# Patient Record
Sex: Male | Born: 1990 | Race: Black or African American | Hispanic: No | Marital: Single | Smoking: Never smoker
Health system: Southern US, Community
[De-identification: ages and names within clinical notes are randomized; demographics above are authoritative.]

## PROBLEM LIST (undated history)

## (undated) DIAGNOSIS — J45909 Unspecified asthma, uncomplicated: Secondary | ICD-10-CM

---
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
## 2015-11-17 ENCOUNTER — Emergency Department (HOSPITAL_COMMUNITY): Admit: 2015-11-17 | Discharge: 2015-11-17 | Discharge status: Absent without leave | Payer: Self-pay

## 2020-12-26 ENCOUNTER — Emergency Department (HOSPITAL_BASED_OUTPATIENT_CLINIC_OR_DEPARTMENT_OTHER)
Admission: EM | Admit: 2020-12-26 | Discharge: 2020-12-27 | Disposition: A | Discharge status: Home / Self Care | Payer: No Typology Code available for payment source | Source: Home / Self Care | Attending: Emergency Medicine | Admitting: Emergency Medicine

## 2020-12-26 ENCOUNTER — Encounter (HOSPITAL_BASED_OUTPATIENT_CLINIC_OR_DEPARTMENT_OTHER): Payer: Self-pay

## 2020-12-26 ENCOUNTER — Encounter (HOSPITAL_BASED_OUTPATIENT_CLINIC_OR_DEPARTMENT_OTHER): Payer: Self-pay | Admitting: *Deleted

## 2020-12-26 ENCOUNTER — Emergency Department (HOSPITAL_BASED_OUTPATIENT_CLINIC_OR_DEPARTMENT_OTHER)
Admission: EM | Admit: 2020-12-26 | Discharge: 2020-12-26 | Disposition: A | Discharge status: Home / Self Care | Payer: No Typology Code available for payment source | Attending: Emergency Medicine | Admitting: Emergency Medicine

## 2020-12-26 ENCOUNTER — Emergency Department (HOSPITAL_BASED_OUTPATIENT_CLINIC_OR_DEPARTMENT_OTHER): Payer: No Typology Code available for payment source

## 2020-12-26 ENCOUNTER — Other Ambulatory Visit: Payer: Self-pay

## 2020-12-26 DIAGNOSIS — M79652 Pain in left thigh: Secondary | ICD-10-CM | POA: Insufficient documentation

## 2020-12-26 DIAGNOSIS — M25522 Pain in left elbow: Secondary | ICD-10-CM | POA: Diagnosis not present

## 2020-12-26 DIAGNOSIS — R112 Nausea with vomiting, unspecified: Secondary | ICD-10-CM | POA: Insufficient documentation

## 2020-12-26 DIAGNOSIS — R911 Solitary pulmonary nodule: Secondary | ICD-10-CM | POA: Insufficient documentation

## 2020-12-26 DIAGNOSIS — Y92411 Interstate highway as the place of occurrence of the external cause: Secondary | ICD-10-CM | POA: Insufficient documentation

## 2020-12-26 DIAGNOSIS — M25562 Pain in left knee: Secondary | ICD-10-CM | POA: Insufficient documentation

## 2020-12-26 DIAGNOSIS — R519 Headache, unspecified: Secondary | ICD-10-CM | POA: Diagnosis not present

## 2020-12-26 DIAGNOSIS — J45909 Unspecified asthma, uncomplicated: Secondary | ICD-10-CM | POA: Insufficient documentation

## 2020-12-26 DIAGNOSIS — F1210 Cannabis abuse, uncomplicated: Secondary | ICD-10-CM

## 2020-12-26 DIAGNOSIS — M25561 Pain in right knee: Secondary | ICD-10-CM | POA: Diagnosis not present

## 2020-12-26 DIAGNOSIS — M542 Cervicalgia: Secondary | ICD-10-CM | POA: Insufficient documentation

## 2020-12-26 HISTORY — DX: Unspecified asthma, uncomplicated: J45.909

## 2020-12-26 LAB — BASIC METABOLIC PANEL
Anion gap: 9 (ref 5–15)
BUN: 10 mg/dL (ref 6–20)
CO2: 26 mmol/L (ref 22–32)
Calcium: 9.3 mg/dL (ref 8.9–10.3)
Chloride: 102 mmol/L (ref 98–111)
Creatinine, Ser: 1.11 mg/dL (ref 0.61–1.24)
GFR, Estimated: >60 mL/min (ref >60)
Glucose, Bld: 115 mg/dL — ABNORMAL HIGH (ref 70–99)
Potassium: 4.1 mmol/L (ref 3.5–5.1)
Sodium: 137 mmol/L (ref 135–145)

## 2020-12-26 LAB — CBC WITH DIFFERENTIAL/PLATELET
Abs Immature Granulocytes: 0.01 10*3/uL (ref 0.00–0.07)
Basophils Absolute: 0 10*3/uL (ref 0.0–0.1)
Basophils Relative: 1 %
Eosinophils Absolute: 0 10*3/uL (ref 0.0–0.5)
Eosinophils Relative: 0 %
HCT: 45 % (ref 39.0–52.0)
Hemoglobin: 15.6 g/dL (ref 13.0–17.0)
Immature Granulocytes: 0 %
Lymphocytes Relative: 15 %
Lymphs Abs: 0.8 10*3/uL (ref 0.7–4.0)
MCH: 29.4 pg (ref 26.0–34.0)
MCHC: 34.7 g/dL (ref 30.0–36.0)
MCV: 84.7 fL (ref 80.0–100.0)
Monocytes Absolute: 0.6 10*3/uL (ref 0.1–1.0)
Monocytes Relative: 11 %
Neutro Abs: 3.8 10*3/uL (ref 1.7–7.7)
Neutrophils Relative %: 73 %
Platelets: 195 10*3/uL (ref 150–400)
RBC: 5.31 MIL/uL (ref 4.22–5.81)
RDW: 13.2 % (ref 11.5–15.5)
WBC: 5.2 10*3/uL (ref 4.0–10.5)
nRBC: 0 % (ref 0.0–0.2)

## 2020-12-26 LAB — CBG MONITORING, ED: Glucose-Capillary: 149 mg/dL — ABNORMAL HIGH (ref 70–99)

## 2020-12-26 MED ORDER — ONDANSETRON 4 MG PO TBDP: 4.0000 mg | ORAL_TABLET | Freq: Once | ORAL | Status: DC

## 2020-12-26 MED ORDER — ONDANSETRON HCL 4 MG/2ML IJ SOLN
4.0000 mg | Freq: Once | INTRAMUSCULAR | Status: AC
Start: 2020-12-26 — End: 2020-12-26
  Administered 2020-12-26: 4 mg via INTRAVENOUS
  Filled 2020-12-26: qty 2

## 2020-12-26 MED ORDER — IOHEXOL 300 MG/ML  SOLN
100.0000 mL | Freq: Once | INTRAMUSCULAR | Status: AC | PRN
  Administered 2020-12-26: 100 mL via INTRAVENOUS

## 2020-12-26 MED ORDER — OXYCODONE-ACETAMINOPHEN 5-325 MG PO TABS: 1.0000 | ORAL_TABLET | Freq: Once | ORAL | Status: DC

## 2020-12-26 MED ORDER — NALOXONE HCL 4 MG/0.1ML NA LIQD
1.0000 | Freq: Once | NASAL | Status: DC
Start: 2020-12-26 — End: 2020-12-27

## 2020-12-26 MED ORDER — FENTANYL CITRATE (PF) 100 MCG/2ML IJ SOLN
50.0000 ug | Freq: Once | INTRAMUSCULAR | Status: AC
  Administered 2020-12-26: 50 ug via INTRAVENOUS
  Filled 2020-12-26: qty 2

## 2020-12-26 NOTE — Discharge Instructions (Addendum)
Your CT of your chest showed evidence of 2 pulmonary nodules in your right lung.  This is an incidental finding but should be something that is monitored by your primary care doctor.
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
## 2020-12-26 NOTE — ED Notes (Signed)
Patient transported to X-ray 

## 2020-12-26 NOTE — ED Notes (Signed)
Bedside handoff report given to [facility]
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
## 2020-12-26 NOTE — ED Notes (Signed)
Patient transported to CT via stretcher, sr x 2 up, Handcuffs removed by [employer] staff, [employer] at bedside
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
## 2020-12-26 NOTE — ED Triage Notes (Signed)
Pt had gone AMA, pt returns with [employer] pt c/o HA, dizziness, vomited x 3, large amt of emesis. Immediately
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
## 2020-12-27 ENCOUNTER — Other Ambulatory Visit: Payer: Self-pay

## 2020-12-27 LAB — RAPID URINE DRUG SCREEN, HOSP PERFORMED
Amphetamines: NOT DETECTED
Barbiturates: NOT DETECTED
Benzodiazepines: NOT DETECTED
Cocaine: NOT DETECTED
Opiates: NOT DETECTED
Tetrahydrocannabinol: POSITIVE — AB

## 2020-12-27 NOTE — ED Notes (Signed)
Patient ambulated without difficulty. Patient PO challenged successfully. Patient informed of need for future CT scan in 3 months per discharge paperwork.

## 2021-01-13 ENCOUNTER — Emergency Department (HOSPITAL_BASED_OUTPATIENT_CLINIC_OR_DEPARTMENT_OTHER)
Admission: EM | Admit: 2021-01-13 | Discharge: 2021-01-14 | Disposition: A | Discharge status: Home / Self Care | Payer: Self-pay | Attending: Emergency Medicine | Admitting: Emergency Medicine

## 2021-01-13 ENCOUNTER — Other Ambulatory Visit: Payer: Self-pay

## 2021-01-13 ENCOUNTER — Encounter (HOSPITAL_BASED_OUTPATIENT_CLINIC_OR_DEPARTMENT_OTHER): Payer: Self-pay | Admitting: *Deleted

## 2021-01-13 ENCOUNTER — Emergency Department (HOSPITAL_BASED_OUTPATIENT_CLINIC_OR_DEPARTMENT_OTHER): Payer: Self-pay

## 2021-01-13 DIAGNOSIS — T148XXA Other injury of unspecified body region, initial encounter: Secondary | ICD-10-CM

## 2021-01-13 DIAGNOSIS — M25562 Pain in left knee: Secondary | ICD-10-CM

## 2021-01-13 DIAGNOSIS — J45909 Unspecified asthma, uncomplicated: Secondary | ICD-10-CM | POA: Insufficient documentation

## 2021-01-13 DIAGNOSIS — S80212A Abrasion, left knee, initial encounter: Secondary | ICD-10-CM | POA: Insufficient documentation

## 2021-01-13 DIAGNOSIS — Y92410 Unspecified street and highway as the place of occurrence of the external cause: Secondary | ICD-10-CM | POA: Insufficient documentation

## 2021-01-13 DIAGNOSIS — Z23 Encounter for immunization: Secondary | ICD-10-CM | POA: Insufficient documentation

## 2021-01-13 MED ORDER — HYDROCODONE-ACETAMINOPHEN 7.5-325 MG PO TABS: 1.0000 | ORAL_TABLET | Freq: Four times a day (QID) | ORAL | 0 refills | Status: DC | PRN

## 2021-01-13 MED ORDER — CEPHALEXIN 500 MG PO CAPS: 500.0000 mg | ORAL_CAPSULE | Freq: Four times a day (QID) | ORAL | 0 refills | Status: DC

## 2021-01-13 MED ORDER — CEPHALEXIN 250 MG PO CAPS
500.0000 mg | ORAL_CAPSULE | Freq: Once | ORAL | Status: AC
  Administered 2021-01-13: 500 mg via ORAL
  Filled 2021-01-13: qty 2

## 2021-01-13 MED ORDER — TETANUS-DIPHTH-ACELL PERTUSSIS 5-2.5-18.5 LF-MCG/0.5 IM SUSY
0.5000 mL | PREFILLED_SYRINGE | Freq: Once | INTRAMUSCULAR | Status: AC
  Administered 2021-01-13: 0.5 mL via INTRAMUSCULAR
  Filled 2021-01-13: qty 0.5

## 2021-01-13 MED ORDER — HYDROCODONE-ACETAMINOPHEN 5-325 MG PO TABS
2.0000 | ORAL_TABLET | Freq: Once | ORAL | Status: AC
  Administered 2021-01-13: 2 via ORAL
  Filled 2021-01-13: qty 2

## 2021-01-13 MED ORDER — BACITRACIN ZINC 500 UNIT/GM EX OINT: 1.0000 "application " | TOPICAL_OINTMENT | Freq: Two times a day (BID) | CUTANEOUS | 0 refills | Status: DC

## 2021-01-13 MED ORDER — HYDROCODONE-ACETAMINOPHEN 7.5-325 MG PO TABS
1.0000 | ORAL_TABLET | Freq: Once | ORAL | Status: DC
Start: 2021-01-13 — End: 2021-01-13

## 2021-01-13 MED ORDER — HYDROCODONE-ACETAMINOPHEN 7.5-325 MG PO TABS: 1.0000 | ORAL_TABLET | Freq: Four times a day (QID) | ORAL | 0 refills | Status: AC | PRN
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
## 2021-01-13 NOTE — ED Notes (Signed)
INSTRUCTED PT TO REMAIN NPO UNTIL FURTHER ORDERS

## 2021-01-13 NOTE — ED Notes (Signed)
Pt states he took 2 500mg  Tylenol PO approx prior to ED arrival

## 2021-01-13 NOTE — ED Notes (Signed)
Patient transported to X-ray 

## 2021-01-13 NOTE — Discharge Instructions (Addendum)
You have been seen and discharged from the emergency department.  Your x-rays were negative for fracture.  The knee wound was cleaned, there was possible retained foreign body.  You need to follow-up with orthopedics for evaluation of possible foreign body as well as possible ligamental injury to the knee.  Use crutches and Ace wrap to be nonweightbearing on the left knee.  Take antibiotic as directed.  Use antibiotic ointment as directed.  Keep the wound dressed for the next 48 hours. You have been prescribed pain medicine, take as directed.  Do not mix this medication with alcohol or other sedating medications. Do not drive or do heavy physical activity and to know how this medication affects you.  It may cause drowsiness. Take home medications as prescribed. If you have any worsening symptoms, severe pain, swelling of the lower extremity or further concerns for your health please return to an emergency department for further evaluation.
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
## 2021-02-01 ENCOUNTER — Emergency Department (HOSPITAL_COMMUNITY): Payer: Self-pay

## 2021-02-01 ENCOUNTER — Encounter (HOSPITAL_COMMUNITY): Payer: Self-pay | Admitting: Emergency Medicine

## 2021-02-01 ENCOUNTER — Encounter (HOSPITAL_COMMUNITY): Admission: EM | Disposition: A | Discharge status: Home / Self Care | Payer: Self-pay | Source: Home / Self Care

## 2021-02-01 ENCOUNTER — Inpatient Hospital Stay (HOSPITAL_COMMUNITY)
Admission: EM | Admit: 2021-02-01 | Discharge: 2021-02-05 | DRG: 330 | Disposition: A | Discharge status: Home / Self Care | Payer: Self-pay | Attending: General Surgery | Admitting: General Surgery

## 2021-02-01 ENCOUNTER — Emergency Department (HOSPITAL_COMMUNITY): Payer: Self-pay | Admitting: Anesthesiology

## 2021-02-01 ENCOUNTER — Other Ambulatory Visit: Payer: Self-pay

## 2021-02-01 DIAGNOSIS — Z23 Encounter for immunization: Secondary | ICD-10-CM

## 2021-02-01 DIAGNOSIS — Z91013 Allergy to seafood: Secondary | ICD-10-CM

## 2021-02-01 DIAGNOSIS — Z20822 Contact with and (suspected) exposure to covid-19: Secondary | ICD-10-CM | POA: Diagnosis present

## 2021-02-01 DIAGNOSIS — Y9289 Other specified places as the place of occurrence of the external cause: Secondary | ICD-10-CM

## 2021-02-01 DIAGNOSIS — J45909 Unspecified asthma, uncomplicated: Secondary | ICD-10-CM | POA: Diagnosis present

## 2021-02-01 DIAGNOSIS — S36498A Other injury of other part of small intestine, initial encounter: Principal | ICD-10-CM | POA: Diagnosis present

## 2021-02-01 DIAGNOSIS — S36593A Other injury of sigmoid colon, initial encounter: Secondary | ICD-10-CM | POA: Diagnosis present

## 2021-02-01 DIAGNOSIS — R112 Nausea with vomiting, unspecified: Secondary | ICD-10-CM | POA: Diagnosis not present

## 2021-02-01 DIAGNOSIS — W3400XA Accidental discharge from unspecified firearms or gun, initial encounter: Secondary | ICD-10-CM

## 2021-02-01 DIAGNOSIS — I10 Essential (primary) hypertension: Secondary | ICD-10-CM | POA: Diagnosis not present

## 2021-02-01 HISTORY — PX: LAPAROTOMY: SHX154

## 2021-02-01 HISTORY — PX: BOWEL RESECTION: SHX1257

## 2021-02-01 HISTORY — PX: COLON RESECTION SIGMOID: SHX6737

## 2021-02-01 LAB — BPAM RBC
Blood Product Expiration Date: 202206112359
Blood Product Expiration Date: 202206112359
ISSUE DATE / TIME: 202205140403
ISSUE DATE / TIME: 202205140403
Unit Type and Rh: 5100
Unit Type and Rh: 5100

## 2021-02-01 LAB — COMPREHENSIVE METABOLIC PANEL
ALT: 12 U/L (ref 0–44)
AST: 25 U/L (ref 15–41)
Albumin: 4.8 g/dL (ref 3.5–5.0)
Alkaline Phosphatase: 46 U/L (ref 38–126)
Anion gap: 19 — ABNORMAL HIGH (ref 5–15)
BUN: 14 mg/dL (ref 6–20)
CO2: 15 mmol/L — ABNORMAL LOW (ref 22–32)
Calcium: 9.5 mg/dL (ref 8.9–10.3)
Chloride: 105 mmol/L (ref 98–111)
Creatinine, Ser: 1.43 mg/dL — ABNORMAL HIGH (ref 0.61–1.24)
GFR, Estimated: >60 mL/min (ref >60)
Glucose, Bld: 165 mg/dL — ABNORMAL HIGH (ref 70–99)
Potassium: 3 mmol/L — ABNORMAL LOW (ref 3.5–5.1)
Sodium: 139 mmol/L (ref 135–145)
Total Bilirubin: 0.5 mg/dL (ref 0.3–1.2)
Total Protein: 8.1 g/dL (ref 6.5–8.1)

## 2021-02-01 LAB — CBC
HCT: 44 % (ref 39.0–52.0)
HCT: 39.4 % (ref 39.0–52.0)
Hemoglobin: 14.6 g/dL (ref 13.0–17.0)
Hemoglobin: 13.4 g/dL (ref 13.0–17.0)
MCH: 29.3 pg (ref 26.0–34.0)
MCH: 29.6 pg (ref 26.0–34.0)
MCHC: 33.2 g/dL (ref 30.0–36.0)
MCHC: 34 g/dL (ref 30.0–36.0)
MCV: 88.2 fL (ref 80.0–100.0)
MCV: 87 fL (ref 80.0–100.0)
Platelets: 304 10*3/uL (ref 150–400)
Platelets: 211 10*3/uL (ref 150–400)
RBC: 4.99 MIL/uL (ref 4.22–5.81)
RBC: 4.53 MIL/uL (ref 4.22–5.81)
RDW: 13.6 % (ref 11.5–15.5)
RDW: 13.5 % (ref 11.5–15.5)
WBC: 7.8 10*3/uL (ref 4.0–10.5)
WBC: 9.6 10*3/uL (ref 4.0–10.5)
nRBC: 0 % (ref 0.0–0.2)
nRBC: 0 % (ref 0.0–0.2)

## 2021-02-01 LAB — CREATININE, SERUM
Creatinine, Ser: 0.96 mg/dL (ref 0.61–1.24)
GFR, Estimated: >60 mL/min (ref >60)

## 2021-02-01 LAB — TYPE AND SCREEN
ABO/RH(D): A POS
Antibody Screen: NEGATIVE
Unit division: 0
Unit division: 0

## 2021-02-01 LAB — BLOOD PRODUCT ORDER (VERBAL) VERIFICATION

## 2021-02-01 LAB — PROTIME-INR
INR: 1.1 (ref 0.8–1.2)
Prothrombin Time: 13.7 seconds (ref 11.4–15.2)

## 2021-02-01 LAB — POCT I-STAT 7, (LYTES, BLD GAS, ICA,H+H)
Acid-base deficit: 2 mmol/L (ref 0.0–2.0)
Bicarbonate: 24.1 mmol/L (ref 20.0–28.0)
Calcium, Ion: 1.17 mmol/L (ref 1.15–1.40)
HCT: 36 % — ABNORMAL LOW (ref 39.0–52.0)
Hemoglobin: 12.2 g/dL — ABNORMAL LOW (ref 13.0–17.0)
O2 Saturation: 100 %
Patient temperature: 35
Potassium: 3.2 mmol/L — ABNORMAL LOW (ref 3.5–5.1)
Sodium: 140 mmol/L (ref 135–145)
TCO2: 26 mmol/L (ref 22–32)
pCO2 arterial: 42.3 mmHg (ref 32.0–48.0)
pH, Arterial: 7.355 (ref 7.350–7.450)
pO2, Arterial: 609 mmHg — ABNORMAL HIGH (ref 83.0–108.0)

## 2021-02-01 LAB — PREPARE FRESH FROZEN PLASMA: Unit division: 0

## 2021-02-01 LAB — SAMPLE TO BLOOD BANK

## 2021-02-01 LAB — HIV ANTIBODY (ROUTINE TESTING W REFLEX): HIV Screen 4th Generation wRfx: NONREACTIVE

## 2021-02-01 LAB — BPAM FFP
Blood Product Expiration Date: 202205172359
Blood Product Expiration Date: 202205172359
ISSUE DATE / TIME: 202205140404
ISSUE DATE / TIME: 202205140404
Unit Type and Rh: 6200
Unit Type and Rh: 6200

## 2021-02-01 LAB — ETHANOL: Alcohol, Ethyl (B): <10 mg/dL (ref <10)

## 2021-02-01 LAB — RESP PANEL BY RT-PCR (FLU A&B, COVID) ARPGX2
Influenza A by PCR: NEGATIVE
Influenza B by PCR: NEGATIVE
SARS Coronavirus 2 by RT PCR: NEGATIVE

## 2021-02-01 LAB — LACTIC ACID, PLASMA: Lactic Acid, Venous: 0.7 mmol/L (ref 0.5–1.9)

## 2021-02-01 SURGERY — LAPAROTOMY, EXPLORATORY
Anesthesia: General | Site: Abdomen

## 2021-02-01 MED ORDER — ARTIFICIAL TEARS OPHTHALMIC OINT
TOPICAL_OINTMENT | OPHTHALMIC | Status: AC
  Filled 2021-02-01: qty 3.5

## 2021-02-01 MED ORDER — MORPHINE SULFATE (PF) 2 MG/ML IV SOLN
2.0000 mg | INTRAVENOUS | Status: DC | PRN
  Administered 2021-02-01 – 2021-02-02 (×6): 4 mg via INTRAVENOUS
  Filled 2021-02-01 (×6): qty 2

## 2021-02-01 MED ORDER — ACETAMINOPHEN 10 MG/ML IV SOLN
1000.0000 mg | Freq: Once | INTRAVENOUS | Status: DC | PRN
  Administered 2021-02-01: 1000 mg via INTRAVENOUS

## 2021-02-01 MED ORDER — ENOXAPARIN SODIUM 30 MG/0.3ML IJ SOSY
30.0000 mg | PREFILLED_SYRINGE | Freq: Two times a day (BID) | INTRAMUSCULAR | Status: DC
  Administered 2021-02-02 – 2021-02-05 (×7): 30 mg via SUBCUTANEOUS
  Filled 2021-02-01 (×7): qty 0.3

## 2021-02-01 MED ORDER — HYDROMORPHONE HCL 1 MG/ML IJ SOLN
0.2500 mg | INTRAMUSCULAR | Status: DC | PRN
  Administered 2021-02-01 (×2): 0.5 mg via INTRAVENOUS

## 2021-02-01 MED ORDER — DOCUSATE SODIUM 100 MG PO CAPS
100.0000 mg | ORAL_CAPSULE | Freq: Two times a day (BID) | ORAL | Status: DC
  Administered 2021-02-01 – 2021-02-05 (×8): 100 mg via ORAL
  Filled 2021-02-01 (×8): qty 1

## 2021-02-01 MED ORDER — SODIUM CHLORIDE 0.9 % IV SOLN: INTRAVENOUS | Status: DC

## 2021-02-01 MED ORDER — ONDANSETRON HCL 4 MG/2ML IJ SOLN: 4.0000 mg | Freq: Once | INTRAMUSCULAR | Status: DC

## 2021-02-01 MED ORDER — EPHEDRINE 5 MG/ML INJ
INTRAVENOUS | Status: AC
  Filled 2021-02-01: qty 10

## 2021-02-01 MED ORDER — DIPHENHYDRAMINE HCL 50 MG/ML IJ SOLN
INTRAMUSCULAR | Status: AC
  Filled 2021-02-01: qty 1

## 2021-02-01 MED ORDER — SUGAMMADEX SODIUM 200 MG/2ML IV SOLN
INTRAVENOUS | Status: DC | PRN
  Administered 2021-02-01: 400 mg via INTRAVENOUS

## 2021-02-01 MED ORDER — HYDROMORPHONE HCL 1 MG/ML IJ SOLN
INTRAMUSCULAR | Status: AC
  Filled 2021-02-01: qty 1

## 2021-02-01 MED ORDER — MIDAZOLAM HCL 2 MG/2ML IJ SOLN
INTRAMUSCULAR | Status: AC
  Filled 2021-02-01: qty 2

## 2021-02-01 MED ORDER — SODIUM CHLORIDE 0.9 % IV BOLUS
1000.0000 mL | Freq: Once | INTRAVENOUS | Status: AC
  Administered 2021-02-01: 1000 mL via INTRAVENOUS

## 2021-02-01 MED ORDER — SUCCINYLCHOLINE CHLORIDE 200 MG/10ML IV SOSY
PREFILLED_SYRINGE | INTRAVENOUS | Status: AC
  Filled 2021-02-01: qty 20

## 2021-02-01 MED ORDER — TETANUS-DIPHTH-ACELL PERTUSSIS 5-2.5-18.5 LF-MCG/0.5 IM SUSY
0.5000 mL | PREFILLED_SYRINGE | Freq: Once | INTRAMUSCULAR | Status: AC
  Administered 2021-02-01: 0.5 mL via INTRAMUSCULAR

## 2021-02-01 MED ORDER — OXYCODONE HCL 5 MG PO TABS
5.0000 mg | ORAL_TABLET | ORAL | Status: DC | PRN
  Administered 2021-02-01 – 2021-02-03 (×9): 5 mg via ORAL
  Filled 2021-02-01 (×9): qty 1

## 2021-02-01 MED ORDER — MEPERIDINE HCL 25 MG/ML IJ SOLN: 6.2500 mg | INTRAMUSCULAR | Status: DC | PRN

## 2021-02-01 MED ORDER — MIDAZOLAM HCL 5 MG/5ML IJ SOLN
INTRAMUSCULAR | Status: DC | PRN
  Administered 2021-02-01: 2 mg via INTRAVENOUS

## 2021-02-01 MED ORDER — PHENYLEPHRINE 40 MCG/ML (10ML) SYRINGE FOR IV PUSH (FOR BLOOD PRESSURE SUPPORT)
PREFILLED_SYRINGE | INTRAVENOUS | Status: AC
  Filled 2021-02-01: qty 10

## 2021-02-01 MED ORDER — DIPHENHYDRAMINE HCL 50 MG/ML IJ SOLN
INTRAMUSCULAR | Status: DC | PRN
  Administered 2021-02-01: 25 mg via INTRAVENOUS

## 2021-02-01 MED ORDER — PROPOFOL 10 MG/ML IV BOLUS
INTRAVENOUS | Status: DC | PRN
  Administered 2021-02-01: 150 mg via INTRAVENOUS

## 2021-02-01 MED ORDER — ROCURONIUM BROMIDE 10 MG/ML (PF) SYRINGE
PREFILLED_SYRINGE | INTRAVENOUS | Status: AC
  Filled 2021-02-01: qty 20

## 2021-02-01 MED ORDER — ONDANSETRON HCL 4 MG/2ML IJ SOLN
INTRAMUSCULAR | Status: DC | PRN
  Administered 2021-02-01: 4 mg via INTRAVENOUS

## 2021-02-01 MED ORDER — ROCURONIUM BROMIDE 10 MG/ML (PF) SYRINGE
PREFILLED_SYRINGE | INTRAVENOUS | Status: DC | PRN
  Administered 2021-02-01: 100 mg via INTRAVENOUS

## 2021-02-01 MED ORDER — PROPOFOL 10 MG/ML IV BOLUS
INTRAVENOUS | Status: AC
  Filled 2021-02-01: qty 20

## 2021-02-01 MED ORDER — CHLORHEXIDINE GLUCONATE CLOTH 2 % EX PADS
6.0000 | MEDICATED_PAD | Freq: Every day | CUTANEOUS | Status: DC
  Administered 2021-02-01 – 2021-02-02 (×2): 6 via TOPICAL

## 2021-02-01 MED ORDER — ONDANSETRON HCL 4 MG/2ML IJ SOLN
INTRAMUSCULAR | Status: AC
  Filled 2021-02-01: qty 4

## 2021-02-01 MED ORDER — FENTANYL CITRATE (PF) 250 MCG/5ML IJ SOLN
INTRAMUSCULAR | Status: AC
  Filled 2021-02-01: qty 5

## 2021-02-01 MED ORDER — LACTATED RINGERS IV SOLN: INTRAVENOUS | Status: DC | PRN

## 2021-02-01 MED ORDER — PROMETHAZINE HCL 25 MG/ML IJ SOLN: 6.2500 mg | INTRAMUSCULAR | Status: DC | PRN

## 2021-02-01 MED ORDER — DEXTROSE 5 % IV SOLN
INTRAVENOUS | Status: DC | PRN
  Administered 2021-02-01: 3 g via INTRAVENOUS

## 2021-02-01 MED ORDER — FENTANYL CITRATE (PF) 250 MCG/5ML IJ SOLN
INTRAMUSCULAR | Status: DC | PRN
  Administered 2021-02-01: 100 ug via INTRAVENOUS
  Administered 2021-02-01: 50 ug via INTRAVENOUS
  Administered 2021-02-01 (×3): 100 ug via INTRAVENOUS
  Administered 2021-02-01: 50 ug via INTRAVENOUS

## 2021-02-01 MED ORDER — LIDOCAINE 2% (20 MG/ML) 5 ML SYRINGE
INTRAMUSCULAR | Status: AC
  Filled 2021-02-01: qty 10

## 2021-02-01 MED ORDER — ACETAMINOPHEN 10 MG/ML IV SOLN
INTRAVENOUS | Status: AC
  Filled 2021-02-01: qty 100

## 2021-02-01 MED ORDER — SUCCINYLCHOLINE CHLORIDE 200 MG/10ML IV SOSY
PREFILLED_SYRINGE | INTRAVENOUS | Status: DC | PRN
  Administered 2021-02-01: 120 mg via INTRAVENOUS

## 2021-02-01 MED ORDER — 0.9 % SODIUM CHLORIDE (POUR BTL) OPTIME
TOPICAL | Status: DC | PRN
  Administered 2021-02-01: 3000 mL

## 2021-02-01 MED ORDER — METOPROLOL TARTRATE 5 MG/5ML IV SOLN: 5.0000 mg | Freq: Four times a day (QID) | INTRAVENOUS | Status: DC | PRN

## 2021-02-01 MED ORDER — ACETAMINOPHEN 325 MG PO TABS: 650.0000 mg | ORAL_TABLET | ORAL | Status: DC | PRN

## 2021-02-01 SURGICAL SUPPLY — 49 items
APL PRP STRL LF DISP 70% ISPRP (MISCELLANEOUS)
CHLORAPREP W/TINT 26 (MISCELLANEOUS) ×3 IMPLANT
CLIP VESOCCLUDE LG 6/CT (CLIP) ×4 IMPLANT
CLIP VESOCCLUDE MED 6/CT (CLIP) ×4 IMPLANT
CLIP VESOCCLUDE SM WIDE 6/CT (CLIP) ×4 IMPLANT
COVER SURGICAL LIGHT HANDLE (MISCELLANEOUS) ×4 IMPLANT
COVER WAND RF STERILE (DRAPES) ×4 IMPLANT
DRAPE LAPAROSCOPIC ABDOMINAL (DRAPES) ×4 IMPLANT
DRAPE WARM FLUID 44X44 (DRAPES) ×4 IMPLANT
DRSG OPSITE POSTOP 4X10 (GAUZE/BANDAGES/DRESSINGS) IMPLANT
DRSG OPSITE POSTOP 4X12 (GAUZE/BANDAGES/DRESSINGS) ×1 IMPLANT
DRSG OPSITE POSTOP 4X8 (GAUZE/BANDAGES/DRESSINGS) IMPLANT
ELECT BLADE 6.5 EXT (BLADE) IMPLANT
ELECT CAUTERY BLADE 6.4 (BLADE) ×4 IMPLANT
ELECT REM PT RETURN 9FT ADLT (ELECTROSURGICAL) ×4
ELECTRODE REM PT RTRN 9FT ADLT (ELECTROSURGICAL) ×3 IMPLANT
GLOVE SURG SS PI 7.0 STRL IVOR (GLOVE) ×4 IMPLANT
GLOVE SURG UNDER POLY LF SZ7 (GLOVE) ×4 IMPLANT
GOWN STRL REUS W/ TWL LRG LVL3 (GOWN DISPOSABLE) ×6 IMPLANT
GOWN STRL REUS W/TWL LRG LVL3 (GOWN DISPOSABLE) ×8
HANDLE SUCTION POOLE (INSTRUMENTS) ×3 IMPLANT
KIT BASIN OR (CUSTOM PROCEDURE TRAY) ×4 IMPLANT
LIGASURE IMPACT 36 18CM CVD LR (INSTRUMENTS) IMPLANT
LOOP VESSEL MAXI BLUE (MISCELLANEOUS) IMPLANT
LOOP VESSEL MINI RED (MISCELLANEOUS) IMPLANT
NEEDLE 22X1 1/2 (OR ONLY) (NEEDLE) ×3 IMPLANT
PACK GENERAL/GYN (CUSTOM PROCEDURE TRAY) ×4 IMPLANT
PENCIL SMOKE EVACUATOR (MISCELLANEOUS) ×4 IMPLANT
RELOAD PROXIMATE 75MM BLUE (ENDOMECHANICALS) ×8 IMPLANT
RELOAD STAPLE 75 3.8 BLU REG (ENDOMECHANICALS) IMPLANT
SEALER TISSUE X1 CVD JAW (INSTRUMENTS) ×1 IMPLANT
SPONGE LAP 18X18 RF (DISPOSABLE) ×2 IMPLANT
STAPLER GUN LINEAR PROX 60 (STAPLE) ×1 IMPLANT
STAPLER PROXIMATE 75MM BLUE (STAPLE) ×1 IMPLANT
STAPLER VISISTAT 35W (STAPLE) ×4 IMPLANT
SUCTION POOLE HANDLE (INSTRUMENTS) ×4
SUT PDS AB 0 CTX 60 (SUTURE) ×2 IMPLANT
SUT PDS AB 1 TP1 96 (SUTURE) IMPLANT
SUT SILK 2 0 (SUTURE) ×4
SUT SILK 2 0 SH CR/8 (SUTURE) ×4 IMPLANT
SUT SILK 2-0 18XBRD TIE 12 (SUTURE) ×3 IMPLANT
SUT SILK 3 0 (SUTURE) ×4
SUT SILK 3 0 SH CR/8 (SUTURE) ×4 IMPLANT
SUT SILK 3-0 18XBRD TIE 12 (SUTURE) ×3 IMPLANT
TOWEL GREEN STERILE (TOWEL DISPOSABLE) ×4 IMPLANT
TRAY FOL W/BAG SLVR 16FR STRL (SET/KITS/TRAYS/PACK) IMPLANT
TRAY FOLEY MTR SLVR 16FR STAT (SET/KITS/TRAYS/PACK) ×3 IMPLANT
TRAY FOLEY W/BAG SLVR 16FR LF (SET/KITS/TRAYS/PACK) ×4
YANKAUER SUCT BULB TIP NO VENT (SUCTIONS) IMPLANT

## 2021-02-01 NOTE — Anesthesia Procedure Notes (Signed)
Arterial Line Insertion Start/End5/14/2022 4:40 AM, 02/01/2021 4:59 AM Performed by: Zollie Scale, CRNA, CRNA  Patient location: OR. Emergency situation Patient sedated Left, radial was placed Catheter size: 20 G Hand hygiene performed  and maximum sterile barriers used   Attempts: 1 Procedure performed without using ultrasound guided technique. Ultrasound Notes:anatomy identified, needle tip was noted to be adjacent to the nerve/plexus identified and no ultrasound evidence of intravascular and/or intraneural injection Following insertion, dressing applied and Biopatch. Post procedure assessment: normal  Patient tolerated the procedure well with no immediate complications.

## 2021-02-01 NOTE — Plan of Care (Signed)
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
## 2021-02-01 NOTE — ED Notes (Signed)
To or now 

## 2021-02-01 NOTE — Progress Notes (Signed)
Left radial arterial line removed per MD order, catheter intact.  Pressure held for 10 minutes and dressing applied.  Site unremarkable.
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
## 2021-02-01 NOTE — Anesthesia Procedure Notes (Signed)
Procedure Name: Intubation Date/Time: 02/01/2021 4:58 AM Performed by: Zollie Scale, CRNA Pre-anesthesia Checklist: Patient identified, Emergency Drugs available, Suction available, Patient being monitored and Timeout performed Patient Re-evaluated:Patient Re-evaluated prior to induction Oxygen Delivery Method: Circle system utilized Preoxygenation: Pre-oxygenation with 100% oxygen Induction Type: IV induction, Rapid sequence and Cricoid Pressure applied Laryngoscope Size: Glidescope and 4 Grade View: Grade I Tube type: Oral Tube size: 8.0 mm Number of attempts: 1 Airway Equipment and Method: Stylet and Video-laryngoscopy Placement Confirmation: ETT inserted through vocal cords under direct vision,  positive ETCO2 and breath sounds checked- equal and bilateral Secured at: 22 cm Tube secured with: Tape Dental Injury: Teeth and Oropharynx as per pre-operative assessment

## 2021-02-01 NOTE — Anesthesia Preprocedure Evaluation (Addendum)
Anesthesia Evaluation  Patient identified by MRN, date of birth, ID band Patient awake    Reviewed: Allergy & Precautions, Patient's Chart, lab work & pertinent test results, Unable to perform ROS - Chart review onlyPreop documentation limited or incomplete due to emergent nature of procedure.  Airway Mallampati: I  TM Distance: >3 FB Neck ROM: Full    Dental  (+) Dental Advisory Given, Teeth Intact   Pulmonary asthma ,    Pulmonary exam normal breath sounds clear to auscultation       Cardiovascular negative cardio ROS   Rhythm:Regular Rate:Normal     Neuro/Psych negative neurological ROS     GI/Hepatic negative GI ROS, Neg liver ROS,   Endo/Other  negative endocrine ROS  Renal/GU negative Renal ROS     Musculoskeletal negative musculoskeletal ROS (+)   Abdominal   Peds  Hematology negative hematology ROS (+)   Anesthesia Other Findings   Reproductive/Obstetrics                            Anesthesia Physical Anesthesia Plan  ASA: II and emergent  Anesthesia Plan: General   Post-op Pain Management:    Induction: Intravenous  PONV Risk Score and Plan: 4 or greater and Ondansetron, Dexamethasone, Treatment may vary due to age or medical condition and Midazolam  Airway Management Planned: Oral ETT  Additional Equipment: Arterial line  Intra-op Plan:   Post-operative Plan: Possible Post-op intubation/ventilation  Informed Consent: I have reviewed the patients History and Physical, chart, labs and discussed the procedure including the risks, benefits and alternatives for the proposed anesthesia with the patient or authorized representative who has indicated his/her understanding and acceptance.     Dental advisory given and Only emergency history available  Plan Discussed with: CRNA, Anesthesiologist and Surgeon  Anesthesia Plan Comments:       Anesthesia Quick  Evaluation

## 2021-02-01 NOTE — ED Notes (Signed)
bp 142/90

## 2021-02-01 NOTE — ED Notes (Signed)
Pt diaphorectic cool clammy

## 2021-02-01 NOTE — Progress Notes (Signed)
Attempted report twice since 7:30 am.
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
## 2021-02-01 NOTE — ED Notes (Signed)
[facility] took patient to [facility]
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
## 2021-02-01 NOTE — ED Notes (Signed)
Bagged up all patient belongings in paper bags. Behind charge nurse desk at [facility]
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
## 2021-02-01 NOTE — Transfer of Care (Signed)
Immediate Anesthesia Transfer of Care Note  Patient: Thomas Cochran  Procedure(s) Performed: EXPLORATORY LAPAROTOMY (N/A ) SMALL BOWEL RESECTION (N/A Abdomen) SIGMOID COLON REPAIR (Abdomen)  Patient Location: PACU  Anesthesia Type:General  Level of Consciousness: drowsy  Airway & Oxygen Therapy: Patient Spontanous Breathing and Patient connected to face mask oxygen  Post-op Assessment: Report given to RN and Post -op Vital signs reviewed and stable  Post vital signs: Reviewed and stable  Last Vitals:  Vitals Value Taken Time  BP 138/117 02/01/21 0618  Temp    Pulse 76 02/01/21 0621  Resp 20 02/01/21 0621  SpO2 100 % 02/01/21 0621  Vitals shown include unvalidated device data.  Last Pain:  Vitals:   02/01/21 0407  TempSrc:   PainSc: 10-Worst pain ever      Patients Stated Pain Goal: 0 (02/01/21 0335)  Complications: No complications documented.

## 2021-02-02 LAB — CBC
HCT: 39.2 % (ref 39.0–52.0)
Hemoglobin: 13.3 g/dL (ref 13.0–17.0)
MCH: 29.4 pg (ref 26.0–34.0)
MCHC: 33.9 g/dL (ref 30.0–36.0)
MCV: 86.5 fL (ref 80.0–100.0)
Platelets: 219 10*3/uL (ref 150–400)
RBC: 4.53 MIL/uL (ref 4.22–5.81)
RDW: 13.4 % (ref 11.5–15.5)
WBC: 9.8 10*3/uL (ref 4.0–10.5)
nRBC: 0 % (ref 0.0–0.2)

## 2021-02-02 LAB — BASIC METABOLIC PANEL
Anion gap: 9 (ref 5–15)
BUN: 7 mg/dL (ref 6–20)
CO2: 21 mmol/L — ABNORMAL LOW (ref 22–32)
Calcium: 8.7 mg/dL — ABNORMAL LOW (ref 8.9–10.3)
Chloride: 106 mmol/L (ref 98–111)
Creatinine, Ser: 0.97 mg/dL (ref 0.61–1.24)
GFR, Estimated: >60 mL/min (ref >60)
Glucose, Bld: 100 mg/dL — ABNORMAL HIGH (ref 70–99)
Potassium: 3.9 mmol/L (ref 3.5–5.1)
Sodium: 136 mmol/L (ref 135–145)

## 2021-02-02 LAB — URINALYSIS, ROUTINE W REFLEX MICROSCOPIC
Bilirubin Urine: NEGATIVE
Glucose, UA: 50 mg/dL — AB
Hgb urine dipstick: NEGATIVE
Ketones, ur: 80 mg/dL — AB
Leukocytes,Ua: NEGATIVE
Nitrite: NEGATIVE
Protein, ur: NEGATIVE mg/dL
Specific Gravity, Urine: 1.009 (ref 1.005–1.030)
pH: 5 (ref 5.0–8.0)

## 2021-02-02 MED ORDER — ACETAMINOPHEN 500 MG PO TABS
1000.0000 mg | ORAL_TABLET | Freq: Four times a day (QID) | ORAL | Status: DC
  Administered 2021-02-02 – 2021-02-05 (×10): 1000 mg via ORAL
  Filled 2021-02-02 (×10): qty 2
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
## 2021-02-02 NOTE — Anesthesia Postprocedure Evaluation (Signed)
Anesthesia Post Note  Patient: Thomas Cochran  Procedure(s) Performed: EXPLORATORY LAPAROTOMY (N/A ) SMALL BOWEL RESECTION (N/A Abdomen) SIGMOID COLON REPAIR (Abdomen)     Patient location during evaluation: PACU Anesthesia Type: General Level of consciousness: sedated and patient cooperative Pain management: pain level controlled Vital Signs Assessment: post-procedure vital signs reviewed and stable Respiratory status: spontaneous breathing Cardiovascular status: stable Anesthetic complications: no   No complications documented.  Last Vitals:  Vitals:   02/02/21 0700 02/02/21 0731  BP: 132/75 140/84  Pulse: 74 76  Resp: 17 19  Temp:  36.8 C  SpO2: 95% 95%    Last Pain:  Vitals:   02/02/21 0838  TempSrc:   PainSc: 10-Worst pain ever                 Lewie Loron
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
## 2021-02-02 NOTE — Progress Notes (Signed)
OT Cancellation Note  Patient Details Name: Thomas Cochran MRN: [id number] DOB: [birth year]   Cancelled Treatment:    Reason Eval/Treat Not Completed: OT screened, no needs identified, will sign off Pt was seen by PT this date. PT reports pt is functioning at an independent level. No OT needs identified at this time. OT will sign off. Thank you for referral.   Rosey Bath OTR/L Acute Rehabilitation Services Office: [phone]   Rebeca Alert 02/02/2021, 2:08 PM
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
## 2021-02-03 ENCOUNTER — Encounter (HOSPITAL_COMMUNITY): Payer: Self-pay | Admitting: General Surgery

## 2021-02-03 MED ORDER — ONDANSETRON HCL 4 MG PO TABS: 4.0000 mg | ORAL_TABLET | Freq: Three times a day (TID) | ORAL | Status: DC | PRN

## 2021-02-03 MED ORDER — ONDANSETRON HCL 4 MG/2ML IJ SOLN
4.0000 mg | Freq: Four times a day (QID) | INTRAMUSCULAR | Status: DC | PRN
  Administered 2021-02-03 – 2021-02-04 (×2): 4 mg via INTRAVENOUS
  Filled 2021-02-03: qty 2

## 2021-02-03 MED ORDER — MORPHINE SULFATE (PF) 2 MG/ML IV SOLN
2.0000 mg | INTRAVENOUS | Status: DC | PRN
  Administered 2021-02-03 (×2): 4 mg via INTRAVENOUS
  Administered 2021-02-03: 2 mg via INTRAVENOUS
  Administered 2021-02-04 (×3): 4 mg via INTRAVENOUS
  Administered 2021-02-04: 2 mg via INTRAVENOUS
  Administered 2021-02-04 – 2021-02-05 (×2): 4 mg via INTRAVENOUS
  Filled 2021-02-03 (×7): qty 2
  Filled 2021-02-03: qty 1
  Filled 2021-02-03: qty 2

## 2021-02-03 MED ORDER — SIMETHICONE 80 MG PO CHEW
80.0000 mg | CHEWABLE_TABLET | Freq: Four times a day (QID) | ORAL | Status: DC | PRN
  Administered 2021-02-03: 80 mg via ORAL
  Filled 2021-02-03: qty 1

## 2021-02-03 MED ORDER — OXYCODONE HCL 5 MG PO TABS
5.0000 mg | ORAL_TABLET | ORAL | Status: DC | PRN
  Administered 2021-02-03 – 2021-02-05 (×3): 10 mg via ORAL
  Filled 2021-02-03 (×3): qty 2

## 2021-02-03 NOTE — Plan of Care (Signed)
  Problem: Education: Goal: Knowledge of General Education information will improve Description: Including pain rating scale, medication(s)/side effects and non-pharmacologic comfort measures Outcome: Progressing   Problem: Health Behavior/Discharge Planning: Goal: Ability to manage health-related needs will improve Outcome: Progressing   Problem: Clinical Measurements: Goal: Ability to maintain clinical measurements within normal limits will improve Outcome: Progressing Goal: Will remain free from infection Outcome: Completed/Met Goal: Respiratory complications will improve Outcome: Completed/Met   
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
## 2021-02-03 NOTE — Progress Notes (Signed)
Pt called out for nurse, pt had vomited large amount of  thin liquid on floor.Pt has been complaining of being hot in the room. Paged for something for N/V, pt vomited again twice with RN in room. Gave IV zofran. Pt stated he was now cold, temp 99.0. Provider aware.  Recheck temp at 1545- 98.5, BP 150/86, HR 61, morphine given for pain.
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
## 2021-02-04 LAB — BPAM RBC
Blood Product Expiration Date: 202205252359
Blood Product Expiration Date: 202205282359
ISSUE DATE / TIME: 202205141326
ISSUE DATE / TIME: 202205141611
Unit Type and Rh: 9500
Unit Type and Rh: 9500

## 2021-02-04 LAB — BASIC METABOLIC PANEL
Anion gap: 9 (ref 5–15)
BUN: 10 mg/dL (ref 6–20)
CO2: 27 mmol/L (ref 22–32)
Calcium: 9.2 mg/dL (ref 8.9–10.3)
Chloride: 99 mmol/L (ref 98–111)
Creatinine, Ser: 1.18 mg/dL (ref 0.61–1.24)
GFR, Estimated: >60 mL/min (ref >60)
Glucose, Bld: 118 mg/dL — ABNORMAL HIGH (ref 70–99)
Potassium: 4 mmol/L (ref 3.5–5.1)
Sodium: 135 mmol/L (ref 135–145)

## 2021-02-04 LAB — CBC
HCT: 39.9 % (ref 39.0–52.0)
Hemoglobin: 13.6 g/dL (ref 13.0–17.0)
MCH: 29.3 pg (ref 26.0–34.0)
MCHC: 34.1 g/dL (ref 30.0–36.0)
MCV: 86 fL (ref 80.0–100.0)
Platelets: 276 10*3/uL (ref 150–400)
RBC: 4.64 MIL/uL (ref 4.22–5.81)
RDW: 13.3 % (ref 11.5–15.5)
WBC: 7.8 10*3/uL (ref 4.0–10.5)
nRBC: 0 % (ref 0.0–0.2)

## 2021-02-04 LAB — TYPE AND SCREEN
ABO/RH(D): A POS
Antibody Screen: NEGATIVE
Unit division: 0
Unit division: 0

## 2021-02-04 LAB — SURGICAL PATHOLOGY
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
## 2021-02-04 NOTE — Plan of Care (Signed)
  Problem: Clinical Measurements: Goal: Ability to maintain clinical measurements within normal limits will improve Outcome: Progressing Goal: Cardiovascular complication will be avoided Outcome: Progressing   Problem: Activity: Goal: Risk for activity intolerance will decrease Outcome: Progressing   

## 2021-02-05 ENCOUNTER — Other Ambulatory Visit (HOSPITAL_COMMUNITY): Payer: Self-pay

## 2021-02-05 ENCOUNTER — Encounter (HOSPITAL_COMMUNITY): Payer: Self-pay

## 2021-02-05 LAB — CBC
HCT: 35.5 % — ABNORMAL LOW (ref 39.0–52.0)
Hemoglobin: 12.4 g/dL — ABNORMAL LOW (ref 13.0–17.0)
MCH: 29.5 pg (ref 26.0–34.0)
MCHC: 34.9 g/dL (ref 30.0–36.0)
MCV: 84.3 fL (ref 80.0–100.0)
Platelets: 262 10*3/uL (ref 150–400)
RBC: 4.21 MIL/uL — ABNORMAL LOW (ref 4.22–5.81)
RDW: 12.9 % (ref 11.5–15.5)
WBC: 6.3 10*3/uL (ref 4.0–10.5)
nRBC: 0 % (ref 0.0–0.2)

## 2021-02-05 LAB — BASIC METABOLIC PANEL
Anion gap: 8 (ref 5–15)
BUN: 9 mg/dL (ref 6–20)
CO2: 27 mmol/L (ref 22–32)
Calcium: 9 mg/dL (ref 8.9–10.3)
Chloride: 101 mmol/L (ref 98–111)
Creatinine, Ser: 0.91 mg/dL (ref 0.61–1.24)
GFR, Estimated: >60 mL/min (ref >60)
Glucose, Bld: 116 mg/dL — ABNORMAL HIGH (ref 70–99)
Potassium: 3.5 mmol/L (ref 3.5–5.1)
Sodium: 136 mmol/L (ref 135–145)

## 2021-02-05 MED ORDER — IBUPROFEN 200 MG PO TABS: 400.0000 mg | ORAL_TABLET | ORAL | Status: DC | PRN

## 2021-02-05 MED ORDER — DOCUSATE SODIUM 100 MG PO CAPS
100.0000 mg | ORAL_CAPSULE | Freq: Two times a day (BID) | ORAL | 0 refills | Status: AC
  Filled 2021-02-05: qty 10, 5d supply, fill #0

## 2021-02-05 MED ORDER — OXYCODONE HCL 5 MG PO TABS: 5.0000 mg | ORAL_TABLET | Freq: Four times a day (QID) | ORAL | 0 refills | Status: DC | PRN

## 2021-02-05 MED ORDER — POLYETHYLENE GLYCOL 3350 17 G PO PACK: 17.0000 g | PACK | Freq: Every day | ORAL | 0 refills | Status: AC | PRN

## 2021-02-05 MED ORDER — ACETAMINOPHEN 500 MG PO TABS: 1000.0000 mg | ORAL_TABLET | Freq: Four times a day (QID) | ORAL | 0 refills | Status: AC | PRN

## 2021-02-05 MED ORDER — DOCUSATE SODIUM 100 MG PO CAPS: 100.0000 mg | ORAL_CAPSULE | Freq: Two times a day (BID) | ORAL | 0 refills | Status: DC

## 2021-02-05 MED ORDER — OXYCODONE HCL 5 MG PO TABS
5.0000 mg | ORAL_TABLET | Freq: Four times a day (QID) | ORAL | 0 refills | Status: AC | PRN
  Filled 2021-02-05: qty 20, 3d supply, fill #0

## 2021-02-05 MED ORDER — POLYETHYLENE GLYCOL 3350 17 G PO PACK
17.0000 g | PACK | Freq: Every day | ORAL | Status: DC | PRN
  Administered 2021-02-05: 17 g via ORAL
  Filled 2021-02-05: qty 1

## 2021-02-05 MED ORDER — MORPHINE SULFATE (PF) 2 MG/ML IV SOLN
2.0000 mg | INTRAVENOUS | Status: DC | PRN
Start: 2021-02-05 — End: 2021-02-06

## 2021-02-05 MED ORDER — IBUPROFEN 400 MG PO TABS: 400.0000 mg | ORAL_TABLET | ORAL | 0 refills | Status: AC | PRN

## 2021-02-05 NOTE — Progress Notes (Addendum)
Discharge instructions, RX's and follow up appts explained and provided to patient verbalized understanding. TOC pharmacy delivered discharge medications to room. Patient left floor via wheelchair accompanied by staff. No c/o pain or shortness of breath at d/c.  Talaysia Pinheiro, Kae Heller, RN
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
## 2021-02-05 NOTE — Plan of Care (Signed)
  Problem: Education: Goal: Knowledge of General Education information will improve Description: Including pain rating scale, medication(s)/side effects and non-pharmacologic comfort measures 02/05/2021 1435 by Genevie Ann, RN Outcome: Progressing 02/05/2021 1013 by Genevie Ann, RN Outcome: Progressing   Problem: Health Behavior/Discharge Planning: Goal: Ability to manage health-related needs will improve 02/05/2021 1435 by Genevie Ann, RN Outcome: Progressing 02/05/2021 1013 by Genevie Ann, RN Outcome: Progressing   Problem: Clinical Measurements: Goal: Ability to maintain clinical measurements within normal limits will improve Outcome: Progressing

## 2021-02-05 NOTE — Plan of Care (Signed)

## 2021-02-05 NOTE — Plan of Care (Signed)
  Problem: Education: Goal: Knowledge of General Education information will improve Description: Including pain rating scale, medication(s)/side effects and non-pharmacologic comfort measures 02/05/2021 1512 by Genevie Ann, RN Outcome: Adequate for Discharge 02/05/2021 1435 by Genevie Ann, RN Outcome: Progressing 02/05/2021 1013 by Genevie Ann, RN Outcome: Progressing   Problem: Health Behavior/Discharge Planning: Goal: Ability to manage health-related needs will improve 02/05/2021 1512 by Genevie Ann, RN Outcome: Adequate for Discharge 02/05/2021 1435 by Genevie Ann, RN Outcome: Progressing 02/05/2021 1013 by Genevie Ann, RN Outcome: Progressing   Problem: Clinical Measurements: Goal: Ability to maintain clinical measurements within normal limits will improve 02/05/2021 1512 by Genevie Ann, RN Outcome: Adequate for Discharge 02/05/2021 1013 by Genevie Ann, RN Outcome: Progressing Goal: Diagnostic test results will improve Outcome: Adequate for Discharge Goal: Cardiovascular complication will be avoided Outcome: Adequate for Discharge

## 2021-02-05 NOTE — TOC Transition Note (Signed)
Transition of Care [facility]) - CM/SW Discharge Note   Patient Details  Name: Thomas Cochran MRN: [id number] Date of Birth: 01-12-1991  [facility]) CM/SW Contact:  Glennon Mac, RN Phone Number: 02/05/2021, 4:26 PM   Clinical Narrative:  Pt medically stable for discharge home today with GF and family to assist.  Pt is uninsured, but is eligible for medication assistance through [facility] program. DC Rx sent to [facility] to be filled using MATCH letter.      Final next level of care: Home/Self Care Barriers to Discharge: Barriers Resolved   Patient Goals and CMS Choice Patient states their goals for this hospitalization and ongoing recovery are:: to go home                            Discharge Plan and Services   Discharge Planning Services: [facility],Medication Assistance                                 Social Determinants of Health (SDOH) Interventions     Readmission Risk Interventions Readmission Risk Prevention Plan 02/05/2021  Post Dischage Appt Complete  Medication Screening Complete  Transportation Screening Complete  Some recent data might be hidden   Quintella Baton, RN, BSN  Trauma/Neuro ICU Case Manager [phone]
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
